# Patient Record
Sex: Female | Born: 1990 | Race: White | Hispanic: Yes | Marital: Single | Smoking: Current some day smoker
Health system: Southern US, Community
[De-identification: ages and names within clinical notes are randomized; demographics above are authoritative.]

---

## 2011-10-05 ENCOUNTER — Encounter (HOSPITAL_COMMUNITY): Payer: Self-pay | Admitting: Emergency Medicine

## 2011-10-05 ENCOUNTER — Emergency Department (HOSPITAL_COMMUNITY)
Admission: EM | Admit: 2011-10-05 | Discharge: 2011-10-05 | Disposition: A | Discharge status: Home / Self Care | Payer: Self-pay | Attending: Emergency Medicine | Admitting: Emergency Medicine

## 2011-10-05 DIAGNOSIS — F17200 Nicotine dependence, unspecified, uncomplicated: Secondary | ICD-10-CM | POA: Insufficient documentation

## 2011-10-05 DIAGNOSIS — R000 Tachycardia, unspecified: Secondary | ICD-10-CM | POA: Insufficient documentation

## 2011-10-05 DIAGNOSIS — K047 Periapical abscess without sinus: Secondary | ICD-10-CM | POA: Insufficient documentation

## 2011-10-05 MED ORDER — PENICILLIN V POTASSIUM 500 MG PO TABS: 1000.0000 mg | ORAL_TABLET | Freq: Two times a day (BID) | ORAL | Status: AC

## 2011-10-05 MED ORDER — OXYCODONE-ACETAMINOPHEN 5-325 MG PO TABS: 2.0000 | ORAL_TABLET | ORAL | Status: AC | PRN

## 2011-10-05 NOTE — ED Provider Notes (Signed)
History    Scribed for Hurman Horn, MD, the patient was seen in room TR05C/TR05C. This chart was scribed by Katha Cabal.   CSN: [id number]  Arrival date & time 10/05/11  1156   None     Chief Complaint  Patient presents with  . Facial Swelling    (Consider location/radiation/quality/duration/timing/severity/associated sxs/prior treatment) HPI Hurman Horn, MD entered patient's room at 12:30 PM   Debra Weber is a 21 y.o. female who presents to the Emergency Department complaining of persistence of constant right upper jaw pain for past three days.  Pain worse with chewing.  Patient took acetaminophen last night that provided short term relief.  Reports cough that resolved.  Symptoms are not associated with chest pain, vomiting, abdominal pain, neck pain, back pain or fever.   No history of chronic medical conditions.       History reviewed. No pertinent past medical history.  History reviewed. No pertinent past surgical history.  No family history on file.  History  Substance Use Topics  . Smoking status: Current Some Day Smoker  . Smokeless tobacco: Not on file  . Alcohol Use: Yes    OB History    Grav Para Term Preterm Abortions TAB SAB Ect Mult Living                  Review of Systems 10 Systems reviewed and all are negative for acute change except as noted in the HPI.   Allergies  Review of patient's allergies indicates no known allergies.  Home Medications   Current Outpatient Rx  Name Route Sig Dispense Refill  . ACETAMINOPHEN 500 MG PO TABS Oral Take 1,000 mg by mouth every 6 (six) hours as needed. For pain    . OXYCODONE-ACETAMINOPHEN 5-325 MG PO TABS Oral Take 2 tablets by mouth every 4 (four) hours as needed for pain. 15 tablet 0  . PENICILLIN V POTASSIUM 500 MG PO TABS Oral Take 2 tablets (1,000 mg total) by mouth 2 (two) times daily. X 7 days 28 tablet 0    BP 133/70  Pulse 111  Temp 98.9 F (37.2 C) (Oral)  Resp 18  SpO2 100%  LMP  09/22/2011  Physical Exam  Nursing note and vitals reviewed. Constitutional:       Awake, alert, nontoxic appearance.  HENT:  Head: Atraumatic.       tooth #2 tender to percussion with localized gingival tenderness, without swelling or pus, right external maxillary facial tenderness without erythema, induration and submandibular lymphadenopathy    Eyes: Right eye exhibits no discharge. Left eye exhibits no discharge.  Neck: Neck supple.  Cardiovascular: Tachycardia present.   No murmur heard. Pulmonary/Chest: Effort normal. She exhibits no tenderness.  Abdominal: Soft. There is no tenderness. There is no rebound.  Musculoskeletal: She exhibits no tenderness.       Baseline ROM, no obvious new focal weakness.  Neurological:       Mental status and motor strength appears baseline for patient and situation.  Skin: No rash noted.  Psychiatric: She has a normal mood and affect.    ED Course  Procedures (including critical care time)   DIAGNOSTIC STUDIES: Oxygen Saturation is 100% on room air, normal by my interpretation.     COORDINATION OF CARE: 12:35 PM  Physical exam complete.  Will have patient follow up with a dentist.     LABS / RADIOLOGY:   Labs Reviewed - No data to display No results found.  MDM          IMPRESSION: 1. Dental abscess      NEW MEDICATIONS: New Prescriptions   OXYCODONE-ACETAMINOPHEN (PERCOCET) 5-325 MG PER TABLET    Take 2 tablets by mouth every 4 (four) hours as needed for pain.   PENICILLIN V POTASSIUM (VEETID) 500 MG TABLET    Take 2 tablets (1,000 mg total) by mouth 2 (two) times daily. X 7 days     I personally performed the services described in this documentation, which was scribed in my presence. The recorded information has been reviewed and considered.  Patient / Family / Caregiver informed of clinical course, understand medical decision-making process, and agree with plan.          Hurman Horn,  MD 10/11/11 [phone]

## 2011-10-05 NOTE — ED Notes (Signed)
W/ pharmacy

## 2011-10-05 NOTE — ED Notes (Signed)
Pt reports in Friday with a little bit of pain in upper right cheek area. Pt reports swelling to area noted yesterday. Pt denies shortness of breath. Pt talking in complete sentences without difficulty.

## 2011-10-05 NOTE — ED Notes (Signed)
AIDET performed. 

## 2012-03-27 ENCOUNTER — Emergency Department (HOSPITAL_BASED_OUTPATIENT_CLINIC_OR_DEPARTMENT_OTHER): Payer: Self-pay

## 2012-03-27 ENCOUNTER — Emergency Department (HOSPITAL_BASED_OUTPATIENT_CLINIC_OR_DEPARTMENT_OTHER)
Admission: EM | Admit: 2012-03-27 | Discharge: 2012-03-27 | Disposition: A | Discharge status: Home / Self Care | Payer: Self-pay | Attending: Emergency Medicine | Admitting: Emergency Medicine

## 2012-03-27 ENCOUNTER — Encounter (HOSPITAL_BASED_OUTPATIENT_CLINIC_OR_DEPARTMENT_OTHER): Payer: Self-pay | Admitting: *Deleted

## 2012-03-27 DIAGNOSIS — IMO0001 Reserved for inherently not codable concepts without codable children: Secondary | ICD-10-CM | POA: Insufficient documentation

## 2012-03-27 DIAGNOSIS — M25519 Pain in unspecified shoulder: Secondary | ICD-10-CM | POA: Insufficient documentation

## 2012-03-27 DIAGNOSIS — M25559 Pain in unspecified hip: Secondary | ICD-10-CM | POA: Insufficient documentation

## 2012-03-27 DIAGNOSIS — M7918 Myalgia, other site: Secondary | ICD-10-CM

## 2012-03-27 DIAGNOSIS — R52 Pain, unspecified: Secondary | ICD-10-CM | POA: Insufficient documentation

## 2012-03-27 DIAGNOSIS — Z87828 Personal history of other (healed) physical injury and trauma: Secondary | ICD-10-CM | POA: Insufficient documentation

## 2012-03-27 DIAGNOSIS — F17200 Nicotine dependence, unspecified, uncomplicated: Secondary | ICD-10-CM | POA: Insufficient documentation

## 2012-03-27 MED ORDER — DIAZEPAM 5 MG PO TABS
5.0000 mg | ORAL_TABLET | Freq: Once | ORAL | Status: AC
  Administered 2012-03-27: 5 mg via ORAL
  Filled 2012-03-27: qty 1

## 2012-03-27 MED ORDER — KETOROLAC TROMETHAMINE 60 MG/2ML IM SOLN
60.0000 mg | Freq: Once | INTRAMUSCULAR | Status: AC
Start: 2012-03-27 — End: 2012-03-27
  Administered 2012-03-27: 60 mg via INTRAMUSCULAR
  Filled 2012-03-27: qty 2

## 2012-03-27 MED ORDER — CYCLOBENZAPRINE HCL 10 MG PO TABS: 10.0000 mg | ORAL_TABLET | Freq: Two times a day (BID) | ORAL | Status: AC | PRN
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
## 2012-03-27 NOTE — ED Notes (Signed)
Patient transported to X-ray and returned to room in no Resp Distress.
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
## 2012-03-28 NOTE — ED Provider Notes (Signed)
History/physical exam/procedure(s) were performed by non-physician practitioner and as supervising physician I was immediately available for consultation/collaboration. I have reviewed all notes and am in agreement with care and plan.   Hilario Quarry, MD 03/28/12 [phone]

## 2014-10-23 ENCOUNTER — Emergency Department (HOSPITAL_COMMUNITY)
Admission: EM | Admit: 2014-10-23 | Discharge: 2014-10-23 | Disposition: A | Discharge status: Home / Self Care | Payer: Self-pay | Attending: Emergency Medicine | Admitting: Emergency Medicine

## 2014-10-23 ENCOUNTER — Encounter (HOSPITAL_COMMUNITY): Payer: Self-pay | Admitting: *Deleted

## 2014-10-23 DIAGNOSIS — Y9289 Other specified places as the place of occurrence of the external cause: Secondary | ICD-10-CM | POA: Insufficient documentation

## 2014-10-23 DIAGNOSIS — Y9389 Activity, other specified: Secondary | ICD-10-CM | POA: Insufficient documentation

## 2014-10-23 DIAGNOSIS — Z720 Tobacco use: Secondary | ICD-10-CM | POA: Insufficient documentation

## 2014-10-23 DIAGNOSIS — Y998 Other external cause status: Secondary | ICD-10-CM | POA: Insufficient documentation

## 2014-10-23 DIAGNOSIS — S29012A Strain of muscle and tendon of back wall of thorax, initial encounter: Secondary | ICD-10-CM | POA: Insufficient documentation

## 2014-10-23 DIAGNOSIS — X58XXXA Exposure to other specified factors, initial encounter: Secondary | ICD-10-CM | POA: Insufficient documentation

## 2014-10-23 MED ORDER — MELOXICAM 7.5 MG PO TABS: 15.0000 mg | ORAL_TABLET | Freq: Every day | ORAL | Status: AC

## 2014-10-23 MED ORDER — METHOCARBAMOL 500 MG PO TABS: 500.0000 mg | ORAL_TABLET | Freq: Every evening | ORAL | Status: AC | PRN

## 2014-10-23 NOTE — Discharge Instructions (Signed)

## 2014-10-23 NOTE — ED Notes (Signed)
Pt c/o left sided upper back pain x 3 days. Denies injury. Has not taken anything for the pain.

## 2014-10-23 NOTE — ED Provider Notes (Signed)
CSN: [id number]     Arrival date & time 10/23/14  0148 History   First MD Initiated Contact with Patient 10/23/14 0359     Chief Complaint  Patient presents with  . Back Pain     (Consider location/radiation/quality/duration/timing/severity/associated sxs/prior Treatment) HPI Comments: 25 year old female presents to the emergency department for evaluation of left upper back pain 3 days. Patient reports that pain has been intermittent and aching and sharp in nature. Patient denies any radiation of the pain. She reports that it worsens with movement of her left upper extremity. Patient denies any strenuous activity or heavy lifting which prompted onset of her symptoms. No extremity numbness or weakness. Patient states that she has taken Tylenol for pain which provided some temporary relief. Patient denies use of birth control, associated cough or shortness of breath, leg swelling, recent travel, recent surgeries or hospitalizations, or a history of blood clots.  Patient is a 24 y.o. female presenting with back pain. The history is provided by the patient. No language interpreter was used.  Back Pain   History reviewed. No pertinent past medical history. History reviewed. No pertinent past surgical history. No family history on file. Social History  Substance Use Topics  . Smoking status: Current Some Day Smoker  . Smokeless tobacco: None  . Alcohol Use: Yes   OB History    No data available      Review of Systems  Musculoskeletal: Positive for myalgias and back pain.  All other systems reviewed and are negative.   Allergies  Review of patient's allergies indicates no known allergies.  Home Medications   Prior to Admission medications   Medication Sig Start Date End Date Taking? Authorizing Provider  acetaminophen (TYLENOL) 500 MG tablet Take 1,000 mg by mouth every 6 (six) hours as needed. For pain    Historical Provider, MD  cyclobenzaprine (FLEXERIL) 10 MG tablet Take 1  tablet (10 mg total) by mouth 2 (two) times daily as needed for muscle spasms. 03/27/12   Kaitlyn Szekalski, PA-C  meloxicam (MOBIC) 7.5 MG tablet Take 2 tablets (15 mg total) by mouth daily. 10/23/14   Antony Madura, PA-C  methocarbamol (ROBAXIN) 500 MG tablet Take 1 tablet (500 mg total) by mouth at bedtime as needed for muscle spasms. 10/23/14   Antony Madura, PA-C   BP 130/77 mmHg  Pulse 86  Temp(Src) 98.5 F (36.9 C) (Oral)  Resp 16  Ht 4\' 11"  (1.499 m)  Wt 152 lb (68.947 kg)  BMI 30.68 kg/m2  SpO2 100%  LMP 10/16/2014   Physical Exam  Constitutional: She is oriented to person, place, and time. She appears well-developed and well-nourished. No distress.  Nontoxic/nonseptic appearing  HENT:  Head: Normocephalic and atraumatic.  Eyes: Conjunctivae and EOM are normal. No scleral icterus.  Neck: Normal range of motion.  Cardiovascular: Normal rate, regular rhythm and intact distal pulses.   Distal radial pulse 2+ in the left upper extremity  Pulmonary/Chest: Effort normal. No respiratory distress.  Respirations even and unlabored  Musculoskeletal: Normal range of motion.       Thoracic back: She exhibits tenderness and pain. She exhibits normal range of motion, no bony tenderness, no swelling and no deformity.       Back:  Neurological: She is alert and oriented to person, place, and time. She exhibits normal muscle tone. Coordination normal.  GCS 15. Patient moving all extremities.  Skin: Skin is warm and dry. No rash noted. She is not diaphoretic. No erythema. No pallor.  Psychiatric: She has a normal mood and affect. Her behavior is normal.  Nursing note and vitals reviewed.   ED Course  Procedures (including critical care time) Labs Review Labs Reviewed - No data to display  Imaging Review No results found. I have personally reviewed and evaluated these images and lab results as part of my medical decision-making.   EKG Interpretation None      MDM   Final  diagnoses:  Muscle strain of left upper back, initial encounter    24 year old female presents to the emergency department for symptoms consistent with muscle strain of left upper back. Pain is reproducible on palpation and patient is neurovascularly intact. Doubt atypically presenting pulmonary embolus. Patient is PERC negative. Symptoms to be managed as outpatient with Mobic and Robaxin as well as icing. Return precautions discussed and provided. Patient agreeable to plan with no unaddressed concerns. Patient discharged in good condition.   Filed Vitals:   10/23/14 0157 10/23/14 0420  BP: 124/81 130/77  Pulse: 96 86  Temp: 98.2 F (36.8 C) 98.5 F (36.9 C)  TempSrc: Oral Oral  Resp: 18 16  Height:  (1.499 m)   Weight: 152 lb (68.947 kg)   SpO2: 99% 100%     Antony Madura, PA-C 10/23/14 0501  Tomasita Crumble, MD 10/23/14 [phone]

## 2014-12-24 ENCOUNTER — Emergency Department (HOSPITAL_COMMUNITY)
Admission: EM | Admit: 2014-12-24 | Discharge: 2014-12-24 | Disposition: A | Discharge status: Home / Self Care | Payer: Self-pay | Attending: Emergency Medicine | Admitting: Emergency Medicine

## 2014-12-24 ENCOUNTER — Emergency Department (HOSPITAL_COMMUNITY): Payer: Self-pay

## 2014-12-24 ENCOUNTER — Encounter (HOSPITAL_COMMUNITY): Payer: Self-pay | Admitting: Emergency Medicine

## 2014-12-24 DIAGNOSIS — S61411A Laceration without foreign body of right hand, initial encounter: Secondary | ICD-10-CM | POA: Insufficient documentation

## 2014-12-24 DIAGNOSIS — Z720 Tobacco use: Secondary | ICD-10-CM | POA: Insufficient documentation

## 2014-12-24 DIAGNOSIS — Y998 Other external cause status: Secondary | ICD-10-CM | POA: Insufficient documentation

## 2014-12-24 DIAGNOSIS — Y9389 Activity, other specified: Secondary | ICD-10-CM | POA: Insufficient documentation

## 2014-12-24 DIAGNOSIS — Y9289 Other specified places as the place of occurrence of the external cause: Secondary | ICD-10-CM | POA: Insufficient documentation

## 2014-12-24 DIAGNOSIS — Z23 Encounter for immunization: Secondary | ICD-10-CM | POA: Insufficient documentation

## 2014-12-24 DIAGNOSIS — W25XXXA Contact with sharp glass, initial encounter: Secondary | ICD-10-CM | POA: Insufficient documentation

## 2014-12-24 MED ORDER — ONDANSETRON 4 MG PO TBDP
4.0000 mg | ORAL_TABLET | Freq: Once | ORAL | Status: AC
Start: 2014-12-24 — End: 2014-12-24
  Administered 2014-12-24: 4 mg via ORAL
  Filled 2014-12-24: qty 1

## 2014-12-24 MED ORDER — TETANUS-DIPHTH-ACELL PERTUSSIS 5-2.5-18.5 LF-MCG/0.5 IM SUSP
0.5000 mL | Freq: Once | INTRAMUSCULAR | Status: AC
  Administered 2014-12-24: 0.5 mL via INTRAMUSCULAR
  Filled 2014-12-24: qty 0.5

## 2014-12-24 MED ORDER — HYDROCODONE-ACETAMINOPHEN 5-325 MG PO TABS
1.0000 | ORAL_TABLET | Freq: Once | ORAL | Status: AC
  Administered 2014-12-24: 1 via ORAL
  Filled 2014-12-24: qty 1

## 2014-12-24 MED ORDER — LIDOCAINE HCL (PF) 1 % IJ SOLN
5.0000 mL | Freq: Once | INTRAMUSCULAR | Status: AC
  Administered 2014-12-24: 5 mL
  Filled 2014-12-24: qty 5

## 2014-12-24 NOTE — ED Notes (Signed)
Wound irrigated and cleaned. Suture cart/tray bedside.

## 2014-12-24 NOTE — ED Notes (Signed)
Patient left at this time with all belongings. 

## 2014-12-24 NOTE — ED Notes (Signed)
Pt arrives POV with c/o hand laceration, states she cut it on glass but would not give further information. States cut occurred two hours ago. Unknown TDAP

## 2014-12-24 NOTE — ED Notes (Signed)
Patient transported to X-ray 

## 2014-12-24 NOTE — ED Provider Notes (Signed)
CSN: [id number]     Arrival date & time 12/24/14  0530 History   First MD Initiated Contact with Patient 12/24/14 0559     Chief Complaint  Patient presents with  . Extremity Laceration     HPI   Debra Weber is a 24 y.o. female with no pertinent PMH who presents to the ED with right hand laceration, which she states occurred approximately 2 hours ago. She reports she cut her hand on a glass bottle. She states she is unsure if her tetanus is up to date. She reports pain over the site of her laceration. She denies numbness, weakness, paresthesia.   History reviewed. No pertinent past medical history. History reviewed. No pertinent past surgical history. No family history on file. Social History  Substance Use Topics  . Smoking status: Current Some Day Smoker  . Smokeless tobacco: None  . Alcohol Use: Yes   OB History    No data available      Review of Systems  Musculoskeletal: Negative for myalgias and arthralgias.  Skin: Positive for wound.  Neurological: Negative for weakness and numbness.      Allergies  Review of patient's allergies indicates no known allergies.  Home Medications   Prior to Admission medications   Medication Sig Start Date End Date Taking? Authorizing Provider  cyclobenzaprine (FLEXERIL) 10 MG tablet Take 1 tablet (10 mg total) by mouth 2 (two) times daily as needed for muscle spasms. Patient not taking: Reported on 12/24/2014 03/27/12   Emilia Beck, PA-C  meloxicam (MOBIC) 7.5 MG tablet Take 2 tablets (15 mg total) by mouth daily. Patient not taking: Reported on 12/24/2014 10/23/14   Antony Madura, PA-C  methocarbamol (ROBAXIN) 500 MG tablet Take 1 tablet (500 mg total) by mouth at bedtime as needed for muscle spasms. Patient not taking: Reported on 12/24/2014 10/23/14   Antony Madura, PA-C    BP 114/73 mmHg  Pulse 98  Temp(Src) 98.3 F (36.8 C) (Oral)  Resp 16  SpO2 100%  LMP 12/10/2014 (Within Days) Physical Exam  Constitutional: She is  oriented to person, place, and time. She appears well-developed and well-nourished. No distress.  HENT:  Head: Normocephalic and atraumatic.  Right Ear: External ear normal.  Left Ear: External ear normal.  Nose: Nose normal.  Mouth/Throat: Oropharynx is clear and moist.  Neck: Normal range of motion. Neck supple.  Cardiovascular: Normal rate, regular rhythm and intact distal pulses.   Pulmonary/Chest: Effort normal and breath sounds normal.  Musculoskeletal: Normal range of motion. She exhibits tenderness. She exhibits no edema.       Right hand: She exhibits tenderness and laceration. She exhibits normal range of motion, no bony tenderness, normal capillary refill and no deformity. Normal sensation noted. Normal strength noted.       Hands: 1 cm laceration to lateral aspect of right palm, hemostatic. Mild TTP. Strength and sensation intact. Distal pulses intact. Cap refill < 3 seconds. Full range of motion of right hand.  Neurological: She is alert and oriented to person, place, and time.  Skin: Skin is warm and dry. She is not diaphoretic.  Psychiatric: She has a normal mood and affect. Her behavior is normal.  Nursing note and vitals reviewed.   ED Course  .Marland KitchenLaceration Repair Date/Time: 12/24/2014 7:04 AM Performed by: Glean Hess C Authorized by: Glean Hess C Consent: Verbal consent obtained. Risks and benefits: risks, benefits and alternatives were discussed Consent given by: patient Patient understanding: patient states understanding of the procedure being performed  Patient consent: the patient's understanding of the procedure matches consent given Procedure consent: procedure consent matches procedure scheduled Relevant documents: relevant documents present and verified Site marked: the operative site was marked Required items: required blood products, implants, devices, and special equipment available Patient identity confirmed: verbally with patient and  arm band Time out: Immediately prior to procedure a "time out" was called to verify the correct patient, procedure, equipment, support staff and site/side marked as required. Body area: upper extremity Location details: right hand Laceration length: 1 cm Foreign bodies: no foreign bodies Tendon involvement: none Nerve involvement: none Vascular damage: no Anesthesia: local infiltration Local anesthetic: lidocaine 1% without epinephrine Anesthetic total: 5 ml Patient sedated: no Preparation: Patient was prepped and draped in the usual sterile fashion. Irrigation solution: tap water Irrigation method: tap Amount of cleaning: standard Debridement: none Degree of undermining: none Skin closure: 4-0 Prolene Number of sutures: 2 Technique: simple Approximation: close Approximation difficulty: simple Dressing: 4x4 sterile gauze Patient tolerance: Patient tolerated the procedure well with no immediate complications   (including critical care time)  Labs Review Labs Reviewed - No data to display  Imaging Review Dg Hand Complete Right  12/24/2014  CLINICAL DATA:  Laceration with glass palm of hand. EXAM: RIGHT HAND - COMPLETE 3+ VIEW COMPARISON:  None. FINDINGS: External bracelet present over the wrist. No evidence of fracture dislocation. No evidence of radiopaque foreign body within the soft tissues. IMPRESSION: Negative. Electronically Signed   By: Elberta Fortisaniel  Boyle M.D.   On: 12/24/2014 07:05     I have personally reviewed and evaluated these images and lab results as part of my medical decision-making.   EKG Interpretation None      MDM   Final diagnoses:  Hand laceration, right, initial encounter    24 year old female presents with right hand laceration, which occurred approximately 2 hours prior to arrival. Reports mild pain, denies numbness, weakness, paresthesia. Unsure if tetanus is up to date. On exam, 1 cm laceration to lateral aspect of right palm, hemostatic. Mild  TTP. Strength and sensation intact. Distal pulses intact. Cap refill < 3 seconds. Full range of motion of right hand.  Tetanus updated. Pain controlled. Will obtain imaging of right hand.  Imaging negative for fracture, dislocation, or radiopaque FB. Laceration cleaned, repaired, and dressed. Wound hemostatic. Patient to follow-up with PCP for suture removal in 1 week.  Return precautions discussed.  BP 114/73 mmHg  Pulse 98  Temp(Src) 98.3 F (36.8 C) (Oral)  Resp 16  SpO2 100%  LMP 12/10/2014 (Within Days)     Mady Gemmalizabeth C Lanai Conlee, PA-C 12/24/14 [id number]  Dione Boozeavid Glick, MD 12/24/14 2251

## 2014-12-24 NOTE — Discharge Instructions (Signed)
1. Medications: usual home medications 2. Treatment: rest, drink plenty of fluids, keep dressing clean and dry, change dressing daily 3. Follow Up: please followup with [facility] in 1 week for discussion of your diagnoses and further evaluation after today's visit 4. Please return to the ER for fever, severe pain, persistent bleeding, redness, heat, discharge, new or worsening symptoms   Laceration Care, Adult A laceration is a cut that goes through all layers of the skin. The cut also goes into the tissue that is right under the skin. Some cuts heal on their own. Others need to be closed with stitches (sutures), staples, skin adhesive strips, or wound glue. Taking care of your cut lowers your risk of infection and helps your cut to heal better. HOW TO TAKE CARE OF YOUR CUT For stitches or staples:  Keep the wound clean and dry.  If you were given a bandage (dressing), you should change it at least one time per day or as told by your doctor. You should also change it if it gets wet or dirty.  Keep the wound completely dry for the first 24 hours or as told by your doctor. After that time, you may take a shower or a bath. However, make sure that the wound is not soaked in water until after the stitches or staples have been removed.  Clean the wound one time each day or as told by your doctor:  Wash the wound with soap and water.  Rinse the wound with water until all of the soap comes off.  Pat the wound dry with a clean towel. Do not rub the wound.  After you clean the wound, put a thin layer of antibiotic ointment on it as told by your doctor. This ointment:  Helps to prevent infection.  Keeps the bandage from sticking to the wound.  Have your stitches or staples removed as told by your doctor. If your doctor used skin adhesive strips:   Keep the wound clean and dry.  If you were given a bandage, you should change it at least one time per day or as told by your  doctor. You should also change it if it gets dirty or wet.  Do not get the skin adhesive strips wet. You can take a shower or a bath, but be careful to keep the wound dry.  If the wound gets wet, pat it dry with a clean towel. Do not rub the wound.  Skin adhesive strips fall off on their own. You can trim the strips as the wound heals. Do not remove any strips that are still stuck to the wound. They will fall off after a while. If your doctor used wound glue:  Try to keep your wound dry, but you may briefly wet it in the shower or bath. Do not soak the wound in water, such as by swimming.  After you take a shower or a bath, gently pat the wound dry with a clean towel. Do not rub the wound.  Do not do any activities that will make you really sweaty until the skin glue has fallen off on its own.  Do not apply liquid, cream, or ointment medicine to your wound while the skin glue is still on.  If you were given a bandage, you should change it at least one time per day or as told by your doctor. You should also change it if it gets dirty or wet.  If a bandage is placed over  the wound, do not let the tape for the bandage touch the skin glue.  Do not pick at the glue. The skin glue usually stays on for 5-10 days. Then, it falls off of the skin. General Instructions  To help prevent scarring, make sure to cover your wound with sunscreen whenever you are outside after stitches are removed, after adhesive strips are removed, or when wound glue stays in place and the wound is healed. Make sure to wear a sunscreen of at least 30 SPF.  Take over-the-counter and prescription medicines only as told by your doctor.  If you were given antibiotic medicine or ointment, take or apply it as told by your doctor. Do not stop using the antibiotic even if your wound is getting better.  Do not scratch or pick at the wound.  Keep all follow-up visits as told by your doctor. This is important.  Check your  wound every day for signs of infection. Watch for:  Redness, swelling, or pain.  Fluid, blood, or pus.  Raise (elevate) the injured area above the level of your heart while you are sitting or lying down, if possible. GET HELP IF:  You got a tetanus shot and you have any of these problems at the injection site:  Swelling.  Very bad pain.  Redness.  Bleeding.  You have a fever.  A wound that was closed breaks open.  You notice a bad smell coming from your wound or your bandage.  You notice something coming out of the wound, such as wood or glass.  Medicine does not help your pain.  You have more redness, swelling, or pain at the site of your wound.  You have fluid, blood, or pus coming from your wound.  You notice a change in the color of your skin near your wound.  You need to change the bandage often because fluid, blood, or pus is coming from the wound.  You start to have a new rash.  You start to have numbness around the wound. GET HELP RIGHT AWAY IF:  You have very bad swelling around the wound.  Your pain suddenly gets worse and is very bad.  You notice painful lumps near the wound or on skin that is anywhere on your body.  You have a red streak going away from your wound.  The wound is on your hand or foot and you cannot move a finger or toe like you usually can.  The wound is on your hand or foot and you notice that your fingers or toes look pale or bluish.   This information is not intended to replace advice given to you by your health care provider. Make sure you discuss any questions you have with your health care provider.   Document Released: 08/06/2007 Document Revised: 07/04/2014 Document Reviewed: 02/13/2014 Elsevier Interactive Patient Education 2016 [employer].   Emergency Department Resource Guide 1) Find a Doctor and Pay Out of Pocket Although you won't have to find out who is covered by your insurance plan, it is a good idea to ask  around and get recommendations. You will then need to call the office and see if the doctor you have chosen will accept you as a new patient and what types of options they offer for patients who are self-pay. Some doctors offer discounts or will set up payment plans for their patients who do not have insurance, but you will need to ask so you aren't surprised when you get to your  appointment.  2) Contact Your Local Health Department Not all health departments have doctors that can see patients for sick visits, but many do, so it is worth a call to see if yours does. If you don't know where your local health department is, you can check in your phone book. The CDC also has a tool to help you locate your state's health department, and many state websites also have listings of all of their local health departments.  3) Find a Walk-in Clinic If your illness is not likely to be very severe or complicated, you may want to try a walk in clinic. These are popping up all over the country in pharmacies, drugstores, and shopping centers. They're usually staffed by nurse practitioners or physician assistants that have been trained to treat common illnesses and complaints. They're usually fairly quick and inexpensive. However, if you have serious medical issues or chronic medical problems, these are probably not your best option.  No Primary Care Doctor: - Call Health Connect at  [phone] - they can help you locate a primary care doctor that  accepts your insurance, provides certain services, etc. - Physician Referral Service- [phone]  Chronic Pain Problems: Organization         Address  Phone   Notes  [facility]  [phone] Patients need to be referred by their primary care doctor.   Medication Assistance: Organization         Address  Phone   Notes  [facility] Medication [facility] [address] [place] [place], [place] [address] [phone] --Must be a  resident of [facility] -- Must have NO insurance coverage whatsoever (no Medicaid/ Medicare, etc.) -- The pt. MUST have a primary care doctor that directs their care regularly and follows them in the community   [facility]  [phone]   [employer]  [phone]    Agencies that provide inexpensive medical care: Organization         Address  Phone   Notes  [facility]  [phone]   [facility]    [phone]   [facility] [address] [place], [place] [address] [phone]   [facility] [address] [place], [place] [phone]   Planned Parenthood    [phone]   [facility]    [phone]   [facility]  [address], [place] Phone:  [phone], Fax:  [phone] Hours of Operation:  9 am - 6 pm, M-F.  Also accepts Medicaid/Medicare and self-pay.  [facility]  [address], [place] Phone: [phone], Fax: [phone]. Hours of Operation:  8:30 am - 5:30 pm, M-F.  Also accepts Medicaid and self-pay.  [facility] [place] [address] [place] Phone: [phone]   [facility] [address] Natasha Bence [place], [place] [phone], Ext. [phone] Mondays & Thursdays: 7-9 AM.  First 15 patients are seen on a first come, first serve basis.    Medicaid-accepting [facility] Providers:  Organization         Address  Phone   Notes  [facility] [address], [place] [phone] Also accepts self-pay patients.  [facility] [address] Laurell Josephs [place], [place]  [phone]   [facility] [address]  [address], [place] [phone]   [facility] [address], [place] [phone]   Renaye Rakers [address], [place]   [phone] Only accepts  [place] Access [place] patients after they have their name applied to their card.   Self-Pay (no insurance) in [facility]:  Organization         Address  Phone   Notes  Sickle Cell Patients, [facility] Internal Medicine [address] [place], [place] [phone]   [facility] [address] [place], [place] [phone]   [facility] [place]  [address], [place] [phone]   [facility]/Dr. Osei-Bonsu  [address], [place] or [address], [place] [phone] Phone number for both [place] and [place] locations is the same.  Urgent Medical and [facility] [address] [place] [phone]   [facility] [address], [place] or [address] [phone]   [facility] [address], [place] [phone], phone; [phone], fax Sees patients 1st and 3rd Saturday of every month.  Must not qualify for public or private insurance (i.e. Medicaid, Medicare, [place] Health Choice, Veterans' Benefits)  Household income should be no more than 200% of the poverty level The clinic cannot treat you if you are pregnant or think you are pregnant  Sexually transmitted diseases are not treated at the clinic.    Dental Care: Organization         Address  Phone  Notes  [facility] Department of [facility] [address] [place], [place] [phone] Accepts children up to age 7 who are enrolled in [place] or [place] Health Choice; pregnant women with a Medicaid card; and children who have applied for Medicaid or [place] Health Choice, but were declined, whose parents can pay a reduced fee at time of service.  [facility] Department of [facility]  [address], [place] [phone] Accepts children up to age 93 who are enrolled in [place] or [place] Health Choice; pregnant women  with a Medicaid card; and children who have applied for Medicaid or [place] Health Choice, but were declined, whose parents can pay a reduced fee at time of service.  [facility]  [address] [place], [place] [phone] Patients are seen by appointment only. Walk-ins are not accepted. [facility] will see patients 37 years of age and older. Monday - Tuesday (8am-5pm) Most Wednesdays (8:30-5pm) $30 per visit, cash only  [facility] Adult Dental Access PROGRAM  [address], [facility] [phone] Patients are seen by appointment only. Walk-ins are not accepted. [facility] will see patients 34 years of age and older. One Wednesday Evening (Monthly: Volunteer Based).  $30 per visit, cash only  [employer]  [phone] for adults; Children under age 72, call Graduate Pediatric Dentistry at [phone]. Children aged 13-14, please call [phone] to request a pediatric application.  Dental services are provided in all areas of dental care including fillings, crowns and bridges, complete and partial dentures, implants, gum treatment, root canals, and extractions. Preventive care is also provided. Treatment is provided to both adults and children. Patients are selected via a lottery and there is often a waiting list.   [facility] [address]  Dr, [place]  [phone] [email or web address]   [facility] [address], [place], [place] [phone], Ext. [phone] Second and Fourth Thursday of each month, opens at 6:30 AM; Clinic ends at 9 AM.  Patients are seen on a first-come first-served basis, and a limited number are seen during each clinic.   [facility]  [address], [place], [place] [phone]   Eligibility Requirements You must have lived in [place], [place], or [place] counties for at least the last three months.   You cannot be eligible for state or federal sponsored [employer]  insurance, including CIGNAVeterans Administration, [place]icaid, or [employer]icare.   You generally cannot be eligible for healthcare insurance through your employer.    How to apply: Eligibility screenings are held every Tuesday and Wednesday afternoon from 1:00 pm until 4:00 pm. You do not need an appointment for the interview!  [facility] [address], [place], [place] [phone]   [facility]  [phone]   [facility]  [phone]   [facility]  [phone]    Behavioral Health Resources in the Community: Intensive Outpatient Programs Organization         Address  Phone  Notes  [facility] [address], [place], [place] [phone]   [facility] [address], [place], [place] [phone]   [facility] [address], [place], [place]  [phone]   [facility] [address],  [place], [place] [phone] or [phone]   Substance Abuse Resources Organization         Address  Phone  Notes  [facility]  [phone]   [facility]  [phone]   [facility]  [phone]   [facility]  [phone]   Residential & Outpatient Substance Abuse Program  [phone]   Psychological Services Organization         Address  Phone  Notes  [facility]  [phone]- [phone]   [facility]  [phone]- [phone]   [facility] [address], [place] [phone] or [phone]    Mobile Crisis Teams Organization         Address  Phone  Notes  [facility], Mobile Crisis Care Unit  [phone]   [facility]  [address] [place], [place] [phone]   Doristine LocksSharon DeEsch [address] [place] [phone]    Self-Help/Support Groups Organization         Address  Phone             Notes  [facility]  [facility] [place] - variety of support groups  [phone] Call for more information  Narcotics Anonymous (NA), [facility]aring [facility] [address], [place]  2 meetings at this location   Statisticianesidential Treatment Programs Organization         Address  Phone  Notes  [facility] [address],    [place]  [phone]   [facility]  [address], [place] [phone], [place], [place] [phone]   [facility] [address], [place] [phone] Admissions: 8am-3pm M-F  [facility] [address]    [place], [place] [phone]   [facility] [address], [place], [place] [phone]   [facility] [address]  [place], [place] [phone]  [facility] - Intensive Outpatient [address] Laurell Joseph[address], [place], [place] [phone]   [facility] ([facility] [address] [place]  [place], [place] [phone] or [phone]   [facility] ([facility]) [address] [place], [place] [phone] Accepts Medicaid  [facility] [address]  [place] [phone] Substance Abuse/Addiction Treatment   [facility] Organization         Address  Phone  Notes  [facility]  [phone]   Angie Fava, PhD [address] Ervin Knack [place], [place]   [phone] or [phone]   [facility]   [address] [place], [place] [phone]   [facility] [address] [place], [place] [phone] Insurance/Medicaid/sponsorship through [facility] and Families [address]                                    [place], [place] [phone] Therapy/tele-psych/case  [facility] [address], [place] [phone]    Dr. Lolly Mustache  [phone]   [facility]  [employer] [facility] Dept. 1) [address],  [place] 2) [address] [place] 3)  [address], [place] [phone]  [phone]   [facility] Child Abuse Hotline 7657500493 or [phone] (After Hours)

## 2014-12-24 NOTE — ED Notes (Signed)
Admits ETOH consumption tonight. Laceration about 1 inch in length on anterior side of R palm and fifth finer, at proximal knuckle. No meds on board.

## 2014-12-24 NOTE — ED Notes (Signed)
PA bedside to suture.

## 2021-01-03 ENCOUNTER — Emergency Department (HOSPITAL_COMMUNITY): Payer: Self-pay

## 2021-01-03 ENCOUNTER — Other Ambulatory Visit: Payer: Self-pay

## 2021-01-03 ENCOUNTER — Emergency Department (HOSPITAL_COMMUNITY)
Admission: EM | Admit: 2021-01-03 | Discharge: 2021-01-03 | Disposition: A | Discharge status: Home / Self Care | Payer: Self-pay | Attending: Emergency Medicine | Admitting: Emergency Medicine

## 2021-01-03 DIAGNOSIS — J069 Acute upper respiratory infection, unspecified: Secondary | ICD-10-CM | POA: Insufficient documentation

## 2021-01-03 DIAGNOSIS — J209 Acute bronchitis, unspecified: Secondary | ICD-10-CM

## 2021-01-03 DIAGNOSIS — Z20822 Contact with and (suspected) exposure to covid-19: Secondary | ICD-10-CM | POA: Insufficient documentation

## 2021-01-03 DIAGNOSIS — R000 Tachycardia, unspecified: Secondary | ICD-10-CM | POA: Insufficient documentation

## 2021-01-03 DIAGNOSIS — F17200 Nicotine dependence, unspecified, uncomplicated: Secondary | ICD-10-CM | POA: Insufficient documentation

## 2021-01-03 LAB — RESP PANEL BY RT-PCR (FLU A&B, COVID) ARPGX2
Influenza A by PCR: POSITIVE — AB
Influenza B by PCR: NEGATIVE
SARS Coronavirus 2 by RT PCR: NEGATIVE

## 2021-01-03 MED ORDER — AEROCHAMBER PLUS FLO-VU LARGE MISC
1.0000 | Freq: Once | Status: AC
Start: 2021-01-03 — End: 2021-01-03

## 2021-01-03 MED ORDER — AMIODARONE HCL IN DEXTROSE 360-4.14 MG/200ML-% IV SOLN
INTRAVENOUS | Status: AC
  Filled 2021-01-03: qty 200

## 2021-01-03 MED ORDER — AEROCHAMBER PLUS FLO-VU LARGE MISC
Status: AC
  Administered 2021-01-03: 1
  Filled 2021-01-03: qty 1

## 2021-01-03 MED ORDER — ALBUTEROL SULFATE HFA 108 (90 BASE) MCG/ACT IN AERS
6.0000 | INHALATION_SPRAY | Freq: Once | RESPIRATORY_TRACT | Status: AC
  Administered 2021-01-03: 6 via RESPIRATORY_TRACT
  Filled 2021-01-03: qty 6.7

## 2021-01-03 MED ORDER — METHYLPREDNISOLONE 4 MG PO TBPK: ORAL_TABLET | ORAL | 0 refills | Status: AC

## 2021-01-03 MED ORDER — IPRATROPIUM-ALBUTEROL 0.5-2.5 (3) MG/3ML IN SOLN: 3.0000 mL | Freq: Once | RESPIRATORY_TRACT | Status: DC

## 2021-01-03 NOTE — Discharge Instructions (Addendum)
Take 2 puffs of albuterol every 4 hours as needed for cough and wheezing.  Take over the counter treatments like DayQuil/ NyQuil for cough/cold treatment. Follow up on your MyChart app to review results of your COVID-19 and Flu results Get help right away if you: Cough up blood. Have a severe increase in the pain you feel in your chest. Have severe shortness of breath. Faint or keep feeling like you are going to faint. Have a severe headache. Have fever or chills that get worse.

## 2021-01-03 NOTE — ED Notes (Signed)
Pt currently in bathroom

## 2021-01-03 NOTE — ED Provider Notes (Signed)
[facility] Provider Note   CSN: [id number] Arrival date & time: 01/03/21  1051     History Chief Complaint  Patient presents with   Cough    Debra Weber is a 30 y.o. female.  The history is provided by the patient. No language interpreter was used.  Cough Cough characteristics:  Dry and hacking Severity:  Moderate Onset quality:  Gradual Duration:  2 days Timing:  Constant Progression:  Unchanged Chronicity:  New Smoker: no   Context: sick contacts and upper respiratory infection   Context: not animal exposure, not exposure to allergens, not fumes, not occupational exposure, not smoke exposure, not weather changes and not with activity   Relieved by:  Cough suppressants Worsened by:  Exposure to cold air Ineffective treatments:  None tried Associated symptoms: chest pain (with cough), headaches and wheezing   Associated symptoms: no chills, no diaphoresis, no ear pain, no eye discharge, no fever, no myalgias, no rash, no rhinorrhea, no shortness of breath, no sinus congestion, no sore throat and no weight loss       No past medical history on file.  There are no problems to display for this patient.   No past surgical history on file.   OB History   No obstetric history on file.     No family history on file.  Social History   Tobacco Use   Smoking status: Some Days  Substance Use Topics   Alcohol use: Yes   Drug use: No    Home Medications Prior to Admission medications   Medication Sig Start Date End Date Taking? Authorizing Provider  cyclobenzaprine (FLEXERIL) 10 MG tablet Take 1 tablet (10 mg total) by mouth 2 (two) times daily as needed for muscle spasms. Patient not taking: Reported on 12/24/2014 03/27/12   Alvina Chou, PA-C  meloxicam (MOBIC) 7.5 MG tablet Take 2 tablets (15 mg total) by mouth daily. Patient not taking: Reported on 12/24/2014 10/23/14   Antonietta Breach, PA-C  methocarbamol  (ROBAXIN) 500 MG tablet Take 1 tablet (500 mg total) by mouth at bedtime as needed for muscle spasms. Patient not taking: Reported on 12/24/2014 10/23/14   Antonietta Breach, PA-C    Allergies    Patient has no known allergies.  Review of Systems   Review of Systems  Constitutional:  Negative for chills, diaphoresis, fever and weight loss.  HENT:  Negative for ear pain, rhinorrhea and sore throat.   Eyes:  Negative for discharge.  Respiratory:  Positive for cough and wheezing. Negative for shortness of breath.   Cardiovascular:  Positive for chest pain (with cough).  Musculoskeletal:  Negative for myalgias.  Skin:  Negative for rash.  Neurological:  Positive for headaches.   Physical Exam Updated Vital Signs BP (!) 145/105 (BP Location: Right Arm)   Pulse (!) 101   Temp 98.8 F (37.1 C) (Oral)   Resp 15   SpO2 99%   Physical Exam Vitals and nursing note reviewed.  Constitutional:      General: She is not in acute distress.    Appearance: She is well-developed. She is not diaphoretic.  HENT:     Head: Normocephalic and atraumatic.     Right Ear: External ear normal.     Left Ear: External ear normal.     Nose: Nose normal.     Mouth/Throat:     Mouth: Mucous membranes are moist.  Eyes:     General: No scleral icterus.  Conjunctiva/sclera: Conjunctivae normal.  Cardiovascular:     Rate and Rhythm: Normal rate and regular rhythm.     Heart sounds: Normal heart sounds. No murmur heard.   No friction rub. No gallop.  Pulmonary:     Effort: Pulmonary effort is normal. No respiratory distress.     Breath sounds: Wheezing and rhonchi present.  Abdominal:     General: Bowel sounds are normal. There is no distension.     Palpations: Abdomen is soft. There is no mass.     Tenderness: There is no abdominal tenderness. There is no guarding.  Musculoskeletal:     Cervical back: Normal range of motion.  Skin:    General: Skin is warm and dry.  Neurological:     Mental Status:  She is alert and oriented to person, place, and time.  Psychiatric:        Behavior: Behavior normal.    ED Results / Procedures / Treatments   Labs (all labs ordered are listed, but only abnormal results are displayed) Labs Reviewed  RESP PANEL BY RT-PCR (FLU A&B, COVID) [id number]    EKG None  Radiology No results found.  Procedures Procedures   Medications Ordered in ED Medications - No data to display  ED Course  I have reviewed the triage vital signs and the nursing notes.  Pertinent labs & imaging results that were available during my care of the patient were reviewed by me and considered in my medical decision making (see chart for details).    MDM Rules/Calculators/A&P                         Patient here with URI and painful cough.  No hypoxia.  Patient has no pleuritic chest pain.  I ordered and reviewed a chest x-ray which shows no acute abnormalities.  Respiratory panel is pending.  Patient mildly tachycardic however have extremely low suspicion for pulmonary embolus as her chief complaint is not shortness of breath, not pleuritic chest pain.  She has no unilateral leg swelling.  She is not having any exertional dyspnea and her chest pain complaint is only with a barky, painful cough. Patient treated with albuterol here.  She will be discharged with a Medrol Dosepak due to acute bronchitis with bronchospasm.  She may take 2 puffs of her albuterol every 4 hours as needed for cough and wheezing.  Discussed outpatient follow-up and return precautions. Final Clinical Impression(s) / ED Diagnoses Final diagnoses:  None    Rx / DC Orders ED Discharge Orders     None        Arthor Captain, PA-C 01/03/21 1517    Gwyneth Sprout, MD 01/07/21 1429

## 2021-01-03 NOTE — ED Triage Notes (Signed)
Pt reports cough and headache since Monday. Reports its dry, nonproductive cough that causes chest wall pain when coughing and breathing. No resp distress is noted.

## 2022-07-31 IMAGING — DX DG CHEST 1V PORT
1 series · 1 of 1 positions shown · non-contrast
Comparison: None.

CLINICAL DATA: Cough.

EXAM:
PORTABLE CHEST 1 VIEW

[chest]
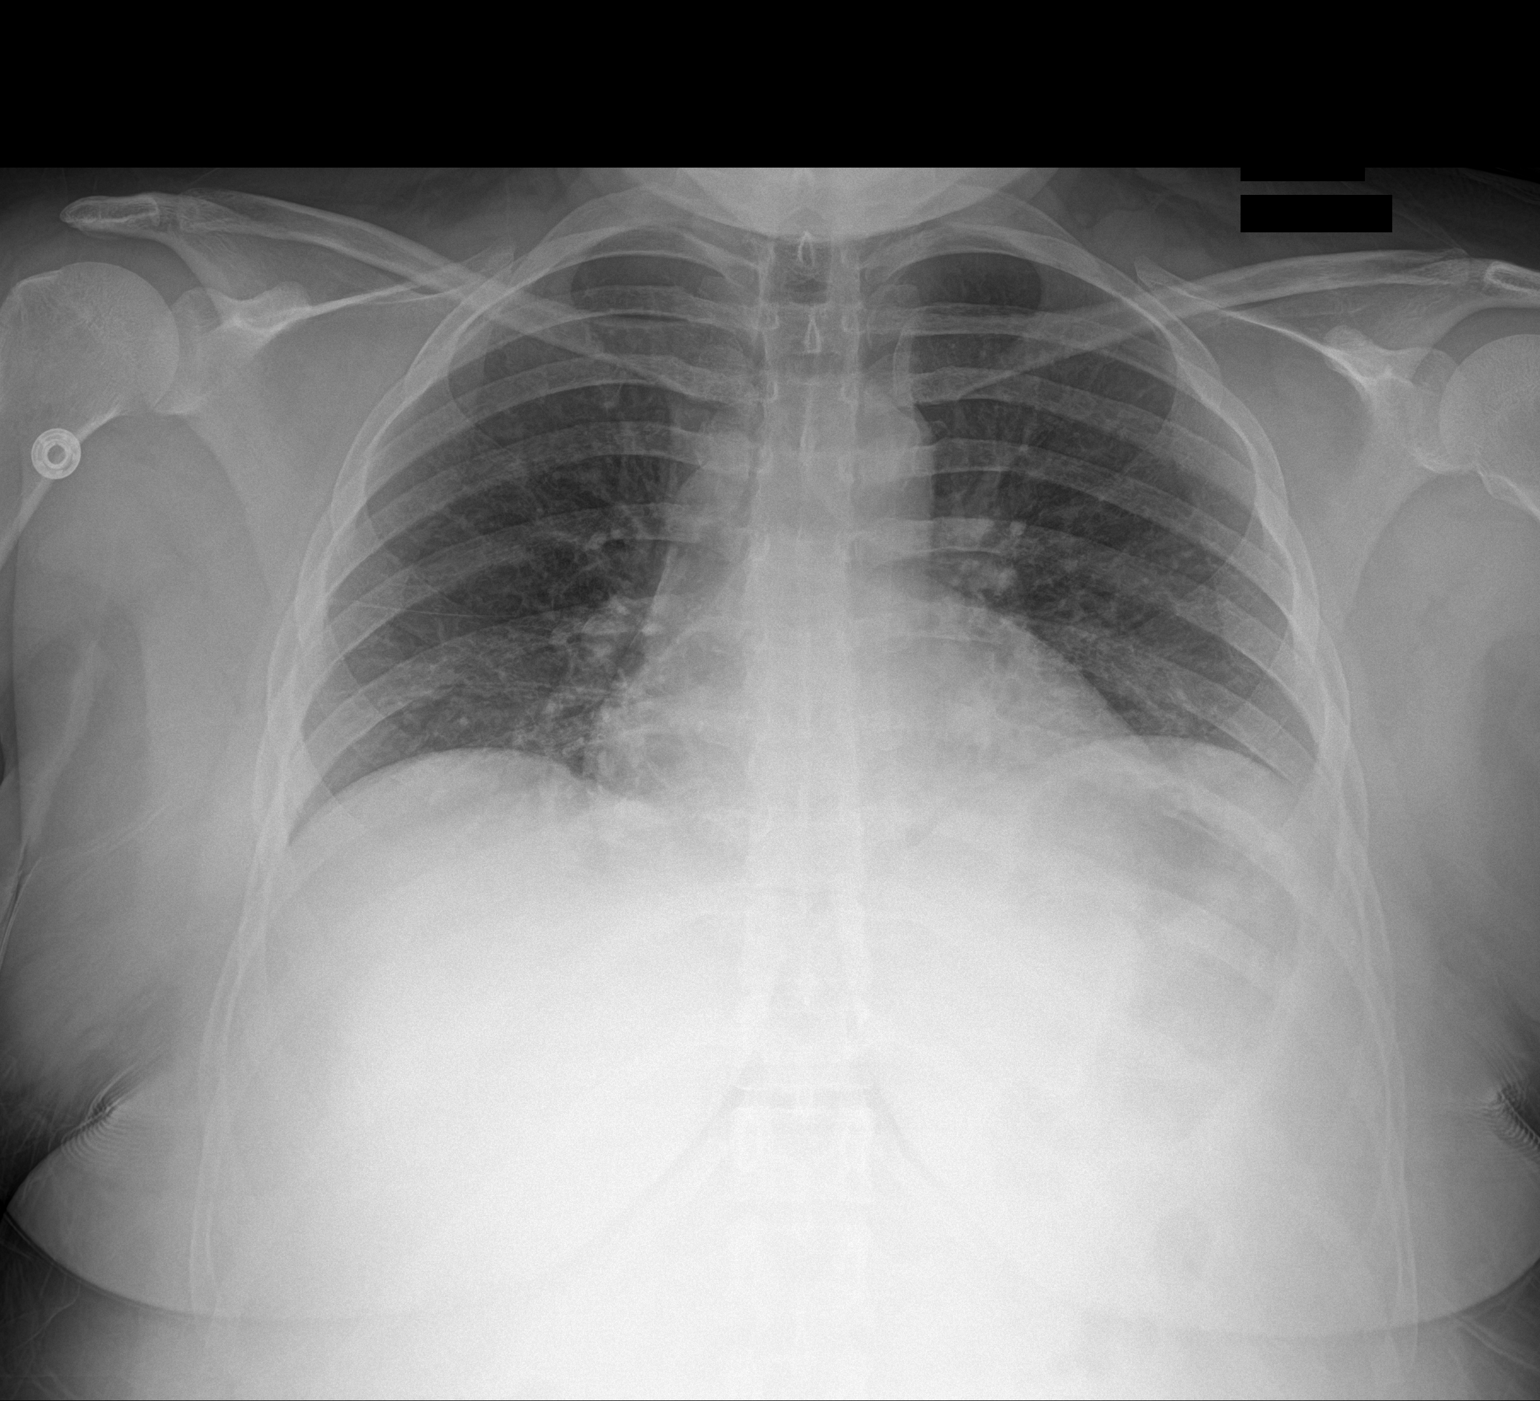

[1 of 1 positions shown; findings below may reference images not displayed]

FINDINGS: Limited low volume study. Cardiomediastinal contours are within
normal limits given low volumes and AP technique. Lungs are clear.
No pneumothorax or large pleural effusion. The finding in the
visualized skeleton.
IMPRESSION: Low volume study.  No evidence of active disease.
